# Patient Record
Sex: Female | Born: 1960 | Race: White | Hispanic: No | Smoking: Never smoker
Health system: Southern US, Community
[De-identification: ages and names within clinical notes are randomized; demographics above are authoritative.]

---

## 2005-08-15 ENCOUNTER — Ambulatory Visit: Payer: Self-pay | Admitting: Sports Medicine

## 2005-09-05 ENCOUNTER — Ambulatory Visit: Payer: Self-pay | Admitting: Sports Medicine

## 2006-06-19 ENCOUNTER — Ambulatory Visit: Payer: Self-pay | Admitting: Sports Medicine

## 2006-06-27 ENCOUNTER — Encounter: Admission: RE | Admit: 2006-06-27 | Discharge: 2006-06-27 | Payer: Self-pay | Admitting: Sports Medicine

## 2006-07-04 ENCOUNTER — Encounter: Admission: RE | Admit: 2006-07-04 | Discharge: 2006-07-04 | Payer: Self-pay | Admitting: Sports Medicine

## 2006-09-25 ENCOUNTER — Ambulatory Visit: Payer: Self-pay | Admitting: Sports Medicine

## 2007-04-30 ENCOUNTER — Ambulatory Visit: Payer: Self-pay | Admitting: Sports Medicine

## 2007-04-30 DIAGNOSIS — M7750 Other enthesopathy of unspecified foot: Secondary | ICD-10-CM | POA: Insufficient documentation

## 2007-04-30 DIAGNOSIS — M7040 Prepatellar bursitis, unspecified knee: Secondary | ICD-10-CM | POA: Insufficient documentation

## 2007-09-10 ENCOUNTER — Ambulatory Visit: Payer: Self-pay | Admitting: Sports Medicine

## 2007-09-10 DIAGNOSIS — M76899 Other specified enthesopathies of unspecified lower limb, excluding foot: Secondary | ICD-10-CM | POA: Insufficient documentation

## 2007-09-10 DIAGNOSIS — M722 Plantar fascial fibromatosis: Secondary | ICD-10-CM | POA: Insufficient documentation

## 2007-09-10 LAB — CONVERTED CEMR LAB
Anti Nuclear Antibody(ANA): NEGATIVE
HCT: 42 % (ref 36.0–46.0)
MCV: 89.7 fL (ref 78.0–100.0)
Platelets: 308 10*3/uL (ref 150–400)
RBC: 4.68 M/uL (ref 3.87–5.11)
Uric Acid, Serum: 5.2 mg/dL (ref 2.4–7.0)
WBC: 8.5 10*3/uL (ref 4.0–10.5)

## 2007-09-16 ENCOUNTER — Telehealth: Payer: Self-pay | Admitting: *Deleted

## 2007-10-15 ENCOUNTER — Ambulatory Visit: Payer: Self-pay | Admitting: Sports Medicine

## 2007-12-31 ENCOUNTER — Ambulatory Visit: Payer: Self-pay | Admitting: Sports Medicine

## 2008-02-24 ENCOUNTER — Ambulatory Visit: Payer: Self-pay | Admitting: Family Medicine

## 2008-06-05 ENCOUNTER — Ambulatory Visit: Payer: Self-pay | Admitting: Sports Medicine

## 2008-06-05 DIAGNOSIS — M25519 Pain in unspecified shoulder: Secondary | ICD-10-CM | POA: Insufficient documentation

## 2010-06-23 ENCOUNTER — Ambulatory Visit: Payer: Self-pay | Admitting: Sports Medicine

## 2010-09-13 NOTE — Assessment & Plan Note (Signed)
Summary: 2 SETS OF ORTHOTICS,MC   Vital Signs:  Patient profile:   50 year old female Pulse rate:   72 / minute BP sitting:   142 / 82  (right arm)  Vitals Entered By: Rochele Pages RN (June 23, 2010 9:34 AM) CC: new orthotics   CC:  new orthotics.  History of Present Illness: Jerney returns w orthtoics that were first made > 6 years ago.  These have helped her resolve PF pain in addition had bilat MT pain and with additional padding we have gotten rid of thatr  however she cannot walk without shoes for more than short distance cannot go without orthotics for any period of time or pain returns  current ones are not feeling supportive now orthotic memory plastic on both appears to have fractured  Physical Exam  General:  Well-developed,well-nourished,in no acute distress; alert,appropriate and cooperative throughout examination Msk:  feet-arch collapses bilaterally with weigth bearing, 3rd metatarsal with mild hammer toe, point tenderness of 3rd metatarsal diaphysis and head, no significant tenderness with palpation of plantar fascia bilaterally  calluses on MT areas are less now that in orthotics  gait pronated at rest   Impression & Recommendations:  Problem # 1:  PLANTAR FASCIITIS, BILATERAL (ICD-728.71)  Her updated medication list for this problem includes:    Diclofenac Sodium 75 Mg Tbec (Diclofenac sodium) .Marland Kitchen... 1 by mouth bid  Patient was fitted for a standard, cushioned, semi-rigid orthotic.  The orthotic was heated and the patient stood on the orthotic blank positioned on the orthotic stand. The patient was positioned in subtalar neutral position and 10 degrees of ankle dorsiflexion in a weight bearing stance. After completion of molding a stable based was applied to the orthotic blank.   The blank was ground to a stable position for weight bearing. size 9 bloue swirl and 9 black stripe base  blue EVA and on blackstripe used white firm polysytrene posting   first ray posts on blue swirl additional orthotic padding  MT on left/ neuroma on lat RT for each orthotic  gait comfortable and pornation controlled on completion  Orders: Orthotic Materials, each unit (L3002)  Problem # 2:  METATARSALGIA (ICD-726.70)  trial of MT padding on all orthotics and on regular shoes  using lat neuroma pad on RT to accomplish this  Orders: Orthotic Materials, each unit (L3002)  reck as needed pending response  Complete Medication List: 1)  Colchicine 0.6 Mg Tabs (Colchicine) .Marland Kitchen.. 1 by mouth bid 2)  Diclofenac Sodium 75 Mg Tbec (Diclofenac sodium) .Marland Kitchen.. 1 by mouth bid 3)  Ranitidine Hcl 150 Mg Caps (Ranitidine hcl) .Marland Kitchen.. 1 by mouth bid   Orders Added: 1)  Est. Patient Level IV [10272] 2)  Orthotic Materials, each unit [L3002]

## 2011-01-04 ENCOUNTER — Encounter: Payer: Self-pay | Admitting: *Deleted

## 2011-01-04 NOTE — Progress Notes (Signed)
  Subjective:    Patient ID: Diamond Stanton, female    DOB: Sep 07, 1960, 50 y.o.   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 Faxed the letter to the school at [phone].    Review of Systems     Objective:   Physical Exam        Assessment & Plan:

## 2012-08-09 ENCOUNTER — Ambulatory Visit (INDEPENDENT_AMBULATORY_CARE_PROVIDER_SITE_OTHER): Payer: BC Managed Care – PPO | Admitting: Sports Medicine

## 2012-08-09 ENCOUNTER — Ambulatory Visit
Admission: RE | Admit: 2012-08-09 | Discharge: 2012-08-09 | Disposition: A | Discharge status: Home / Self Care | Payer: BC Managed Care – PPO | Source: Ambulatory Visit | Attending: Sports Medicine | Admitting: Sports Medicine

## 2012-08-09 VITALS — BP 152/92 | Ht 65.0 in | Wt 210.0 lb

## 2012-08-09 DIAGNOSIS — M79609 Pain in unspecified limb: Secondary | ICD-10-CM

## 2012-08-09 DIAGNOSIS — M21619 Bunion of unspecified foot: Secondary | ICD-10-CM

## 2012-08-09 DIAGNOSIS — M21629 Bunionette of unspecified foot: Secondary | ICD-10-CM

## 2012-08-09 DIAGNOSIS — M79673 Pain in unspecified foot: Secondary | ICD-10-CM

## 2012-08-09 NOTE — Progress Notes (Signed)
  Subjective:    Patient ID: Diamond Stanton, female    DOB: [birth year], 51 y.o.   MRN: [id number]  HPI chief complaint: Left foot pain  Patient comes in today complaining of left foot pain. Pain began after she injured herself back in October. She's not sure of the exact mechanism of injury but she began to have pain across the dorsum of her foot shortly thereafter. He's had problems with both of her feet in the past. In fact, she has seen Dr. Darrick Penna and has custom orthotics in her shoes today. She has a history of bunionettes in each of her feet but she feels like the distal fifth metatarsal is a little more prominent since her injury. She denies any swelling or ecchymosis at the time of the injury. Her pain is intermittent. She has been able to weight-bear.    Review of Systems     Objective:   Physical Exam Well-developed, well-nourished. No acute distress. Awake alert and oriented x3  Left foot: In the standing position she has complete collapse of the transverse arch with a noticeable bunionette. She is tender to palpation over the bunionette but there is no soft tissue swelling. No pain with metatarsal squeeze. Some tenderness to palpation along the fifth metatarsal shaft but not marked. Neurovascularly intact distally. Walking without a limp.  X-rays of her left foot including AP, lateral, and oblique views in the standing position were obtained. X-rays were compared to foot x-rays from 2007. Overall there is been no real change. There is subluxation of the fifth toe on both sets of films. There is no obvious fracture.       Assessment & Plan:  1. Left foot pain secondary to bunionette  I inspected her custom orthotics. They are in good condition. I will try a simple arch strap in addition to the orthotics and I've asked the patient to wear shoes with a wide toe box. I've reassured her that I see no new abnormality on her x-rays when compared to films from 2007. She can continue with  activity as tolerated and will followup if symptoms persist or worsen.

## 2014-05-02 IMAGING — CR DG FOOT COMPLETE 3+V*L*
3 series · 3 of 3 positions shown · non-contrast
Comparison: 06/27/2006

CLINICAL DATA: Pain post fall.

LEFT FOOT - COMPLETE 3+ VIEW

[w foot lat left *]
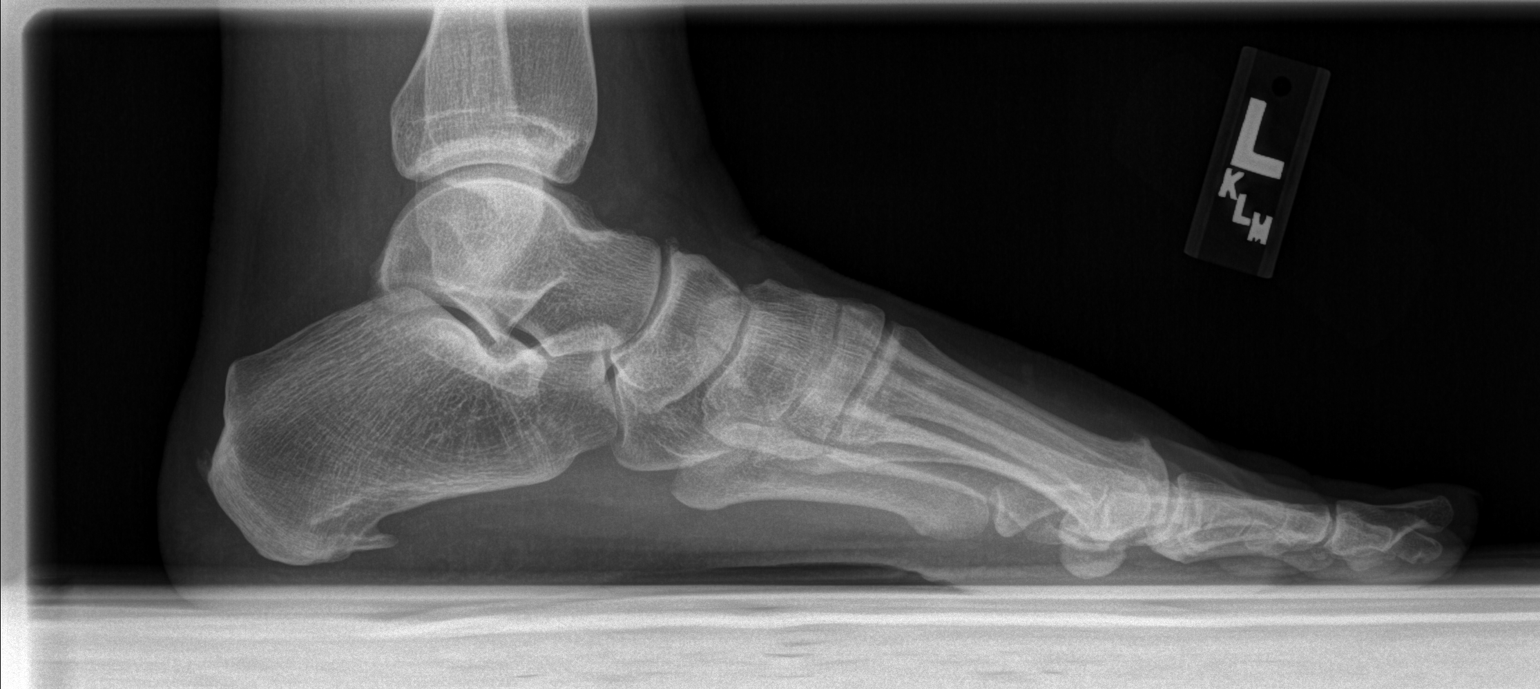

[view not recorded (1 of 2)]
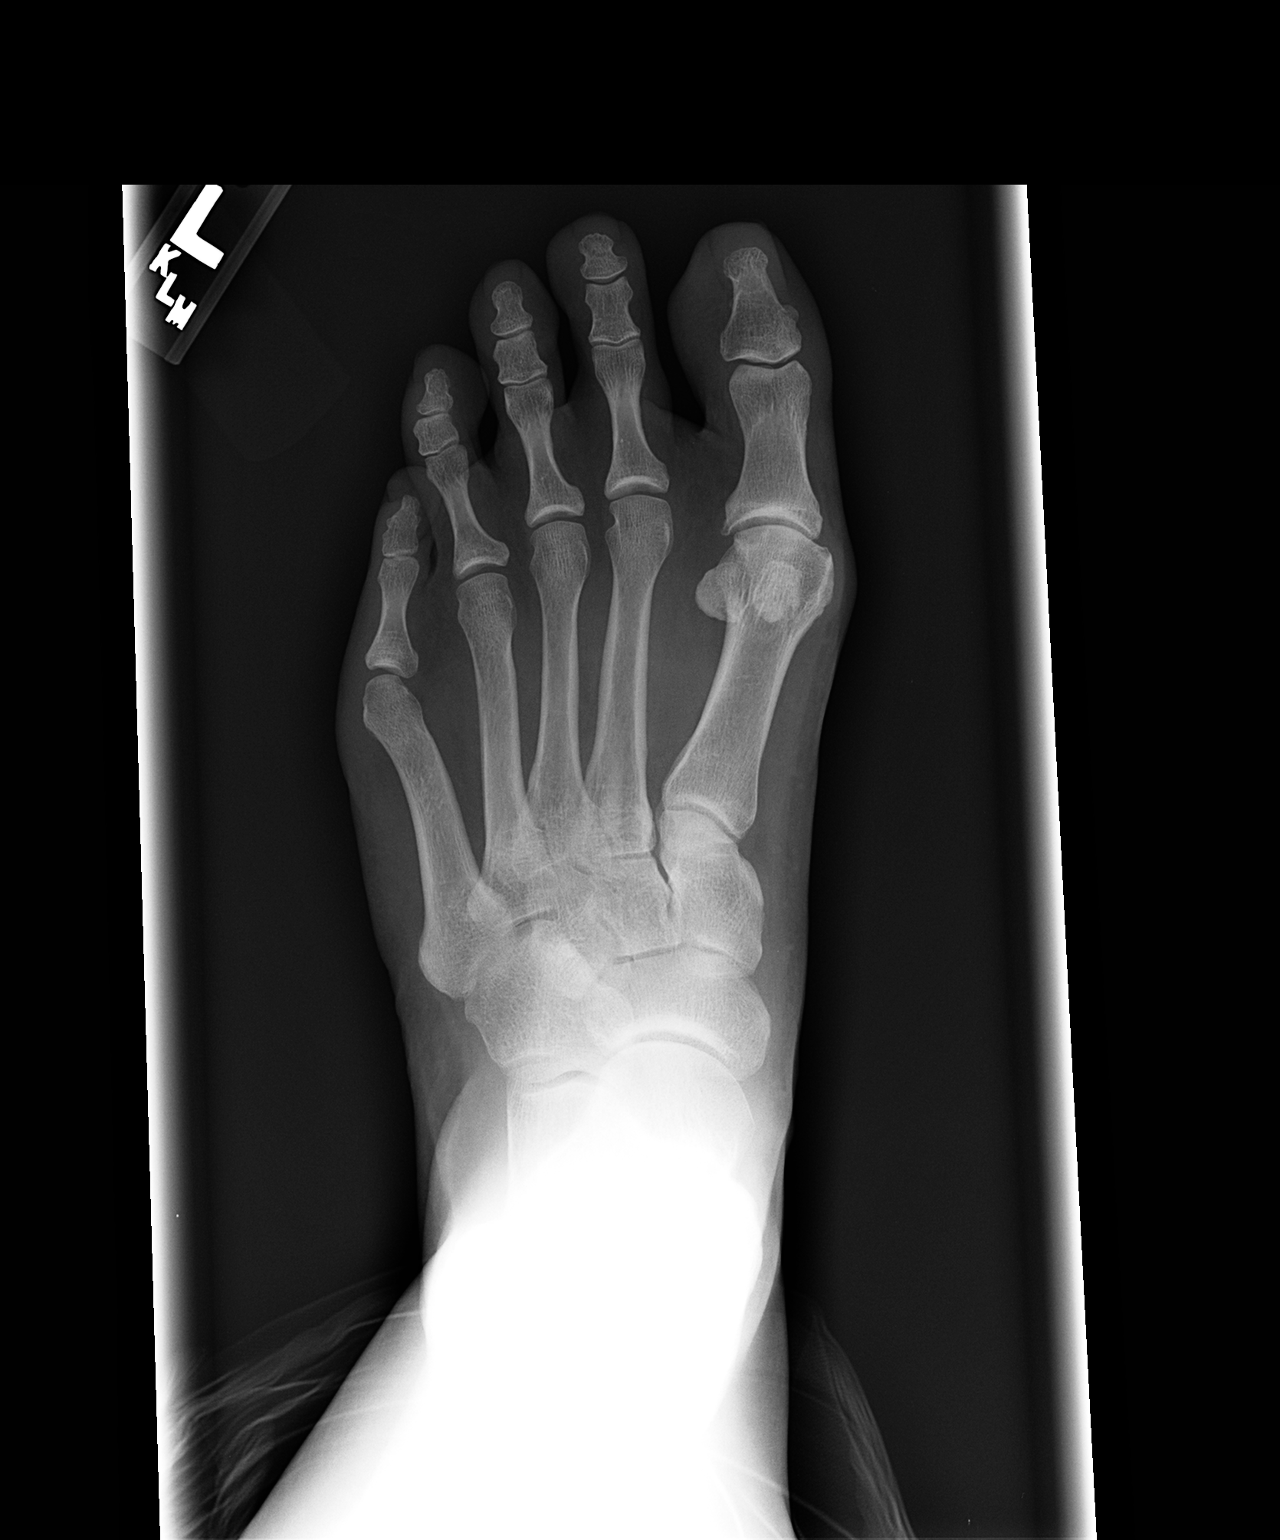

[view not recorded (2 of 2)]
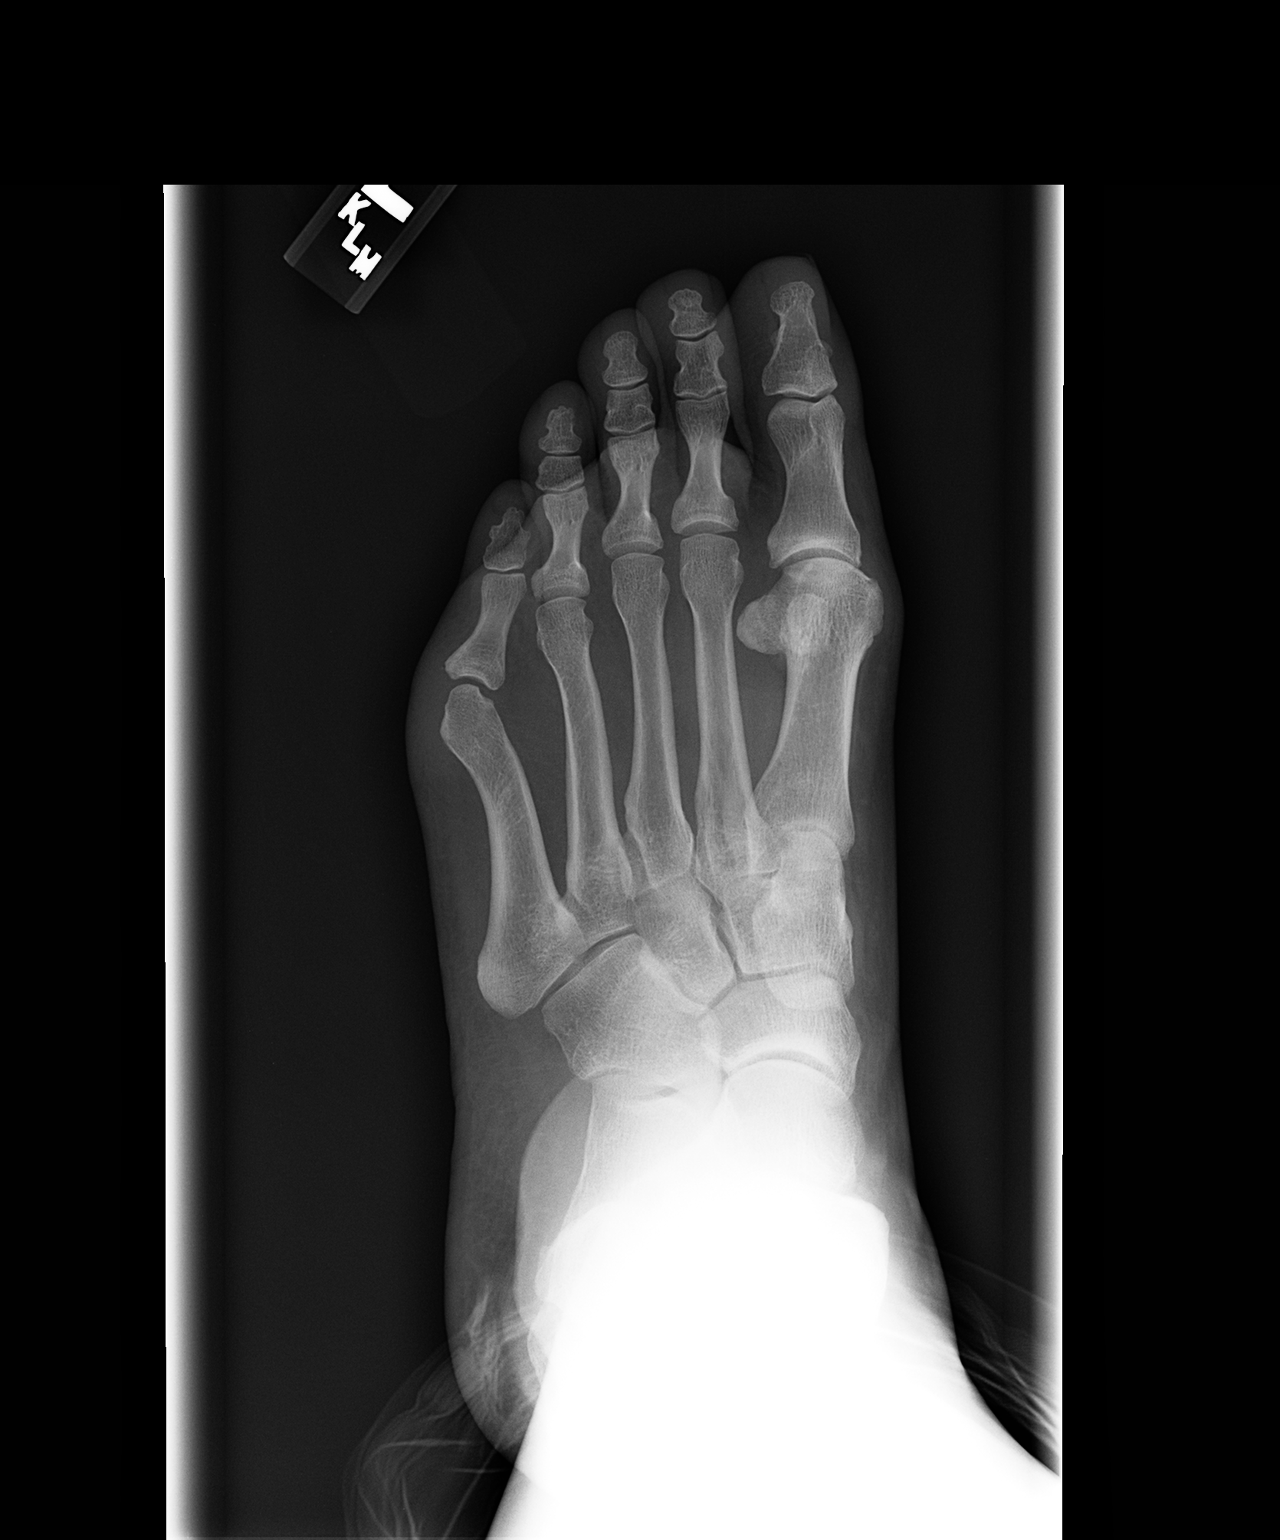

[3 of 3 positions shown; findings below may reference images not displayed]

FINDINGS: Small calcaneal spurs.  Bunion and bunionette formation
as before.  Negative for fracture, dislocation, or other acute bony
abnormality.  Normal mineralization.
IMPRESSION: 1.  Negative for fracture or other acute bony abnormality.

## 2014-06-03 ENCOUNTER — Encounter: Payer: Self-pay | Admitting: Sports Medicine

## 2014-06-03 ENCOUNTER — Ambulatory Visit (INDEPENDENT_AMBULATORY_CARE_PROVIDER_SITE_OTHER): Payer: BC Managed Care – PPO | Admitting: Sports Medicine

## 2014-06-03 VITALS — BP 124/56 | HR 88 | Ht 65.0 in | Wt 200.0 lb

## 2014-06-03 DIAGNOSIS — M6700 Short Achilles tendon (acquired), unspecified ankle: Secondary | ICD-10-CM | POA: Diagnosis not present

## 2014-06-03 NOTE — Assessment & Plan Note (Addendum)
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 -We put in heel lifts in your shoes to unload your achilles tendons to relieve insertional stress -Start heel raises on a book with both feet side-by-side. Do 3 sets of 8 lifts, twice daily. -Try Arnica gel or cream topically to both achilles tendons. -Follow-up in 6-8 weeks for a re-check

## 2014-06-03 NOTE — Patient Instructions (Signed)
-  We put in heel lifts in your shoes to unload your achilles tendons to relieve insertional stress -Start heel raises on a book with both feet side-by-side. Do 3 sets of 8 lifts, twice daily. -Try Arnica gel or cream topically to both achilles tendons. -Follow-up in 6-8 weeks for a re-check

## 2014-06-03 NOTE — Progress Notes (Signed)
   Subjective:    Patient ID: Diamond Stanton, female    DOB: June 28, 1961, 53 y.o.   MRN: [id number]  HPI Ms. Diamond Stanton is a 53 year old female who presents with bilateral ankle pain, the left greater than right. Onset was approximately 2 months ago when she recently completed chemotherapy for breast cancer treatment. She is presently intermission. Her treatment course was complicated by diffuse generalized lymphedema, and she is also status post reconstructive surgery. She has lost finger and toenails is results of her treatments. She would like to be more active, but she says that her ankle pain prevents her from doing so. Location of pain is primarily bilateral posterior insertional Achilles tendon pain. She notes some associated swelling. She currently wears custom orthotics in her shoes which were made many years ago. She has not tried any other medications for this problem.  Past medical history, social history, medications, and allergies were reviewed and are up to date in the chart.   Review of Systems 7 point review of systems was performed and was otherwise negative unless noted in the history of present illness.      Objective:   Physical Exam BP 124/56  Pulse 88  Ht 5\' 5"  (1.651 m)  Wt 200 lb (90.719 kg)  BMI 33.28 kg/m2 GEN: The patient is well-developed well-nourished female and in no acute distress.  She is awake alert and oriented x3. SKIN: warm and well-perfused, no rash  EXTR: No lower extremity edema or calf tenderness Neuro: Strength 5/5 globally. Sensation intact throughout. DTRs 2/4 bilaterally. No focal deficits. Vasc: +2 bilateral distal pulses. No edema.  MSK: Examination of the bilateral feet reveals tenderness to palpation at the Achilles insertion bilaterally, the left greater than right. She has no pain with squeezing and palpation of the Achilles tendon itself. Negative calcaneal squeeze test. Analysis of her foot reveals a bilateral Morton's foot with increased  callus and bunion formation at the first toe. She is a fairly well-preserved medial longitudinal arch. She has no posterior tibialis tendon weakness. There is minimal swelling.  Limited musculoskeletal ultrasound: Long and short axis views were obtained, which showed a calcific spurring and surrounding hypoechoic change at the bilateral distal heel insertion. The bilateral Achilles tendons appear to be normal caliber, measuring 0.42cm on the left and 0.44cm on the right. The remaining posterior tibialis tendons and calcaneus appear normal.    Assessment & Plan:  Please see problem based assessment and plan in the problem list.

## 2014-07-16 ENCOUNTER — Ambulatory Visit: Payer: BC Managed Care – PPO | Admitting: Sports Medicine

## 2014-08-04 ENCOUNTER — Ambulatory Visit (INDEPENDENT_AMBULATORY_CARE_PROVIDER_SITE_OTHER): Payer: BC Managed Care – PPO | Admitting: Sports Medicine

## 2014-08-04 ENCOUNTER — Encounter: Payer: Self-pay | Admitting: Sports Medicine

## 2014-08-04 VITALS — BP 134/88 | HR 83 | Ht 65.0 in | Wt 210.0 lb

## 2014-08-04 DIAGNOSIS — M6700 Short Achilles tendon (acquired), unspecified ankle: Secondary | ICD-10-CM

## 2014-08-04 DIAGNOSIS — I890 Lymphedema, not elsewhere classified: Secondary | ICD-10-CM | POA: Diagnosis not present

## 2014-08-04 NOTE — Progress Notes (Signed)
Patient ID: Diamond Stanton, female   DOB: 26-Jun-1961, 53 y.o.   MRN: [id number]  1 year ago significant swelling in feet after chemo On last visit we tried heel lifts, creams and HEP gentle These have helped and she generally has less pain and a lot of days has no pain over the Achilles  She plans to gradually get back into more exercise but has not been real active She will do some walking but also water exercise or biking  Biggest issue for her is the continued swelling and lymphedema of her left arm She has a custom fitted edema compression chronic covers her left hand and her entire left arm  Physical examination No acute distress BP 134/88 mmHg  Pulse 83  Ht 5\' 5"  (1.651 m)  Wt 210 lb (95.255 kg)  BMI 34.95 kg/m2  The Achilles bilaterally feels normal width No tenderness to palpation No redness or swelling She can walk without a limp  She has orthotics and a heel lift and they look stable  Left arm in spite of the compression sleeve still has a lot of "woody- feeling" edema and swelling

## 2014-08-04 NOTE — Assessment & Plan Note (Signed)
This has improved with the current treatment  I would like her to increase the intensity of her exercise to make the calves stronger  Gradually ease back into more weightbearing exercise  Recheck when necessary if Achilles pain

## 2014-08-04 NOTE — Assessment & Plan Note (Signed)
Because this is persistently troublesome even though is making some improvement we suggested a company that makes a product for compression that may be more comfortable  She plans to look into this product

## 2014-08-04 NOTE — Patient Instructions (Signed)
Your Achilles tendons feel better today on exam  We need to increase the strength exercise to keep this from coming back  Do 1 leg heel lifts on a step  Do some heel raises with weights  See me if this flares or causes pain

## 2015-08-05 ENCOUNTER — Encounter: Payer: Self-pay | Admitting: Sports Medicine

## 2015-08-05 ENCOUNTER — Ambulatory Visit (INDEPENDENT_AMBULATORY_CARE_PROVIDER_SITE_OTHER): Payer: BC Managed Care – PPO | Admitting: Sports Medicine

## 2015-08-05 VITALS — BP 130/82 | Ht 65.0 in | Wt 210.0 lb

## 2015-08-05 DIAGNOSIS — R269 Unspecified abnormalities of gait and mobility: Secondary | ICD-10-CM | POA: Diagnosis not present

## 2015-08-05 DIAGNOSIS — M722 Plantar fascial fibromatosis: Secondary | ICD-10-CM | POA: Diagnosis not present

## 2015-08-06 NOTE — Progress Notes (Signed)
   Subjective:    Patient ID: Diamond Stanton, female    DOB: May 04, 1961, 54 y.o.   MRN: [id number]  HPI chief complaint: Bilateral foot pain  Patient comes in today requesting two new pairs of custom orthotics. She has had custom orthotics made in the past. Her current orthotics are about 52 years old and quite worn. She has a history of metatarsalgia, plantar fasciitis, and Achilles tendinopathy. Today she is without complaint.  Interim medical history reviewed Medications reviewed Allergies reviewed    Review of Systems    as above Objective:   Physical Exam  Well-developed, well-nourished. No acute distress.  Examination of both feet show transverse arch collapse with bilateral Morton's foot. Fairly well-preserved longitudinal arch. Walking without a limp.      Assessment & Plan:  Transverse arch collapse, bilateral feet  Patient had two new pairs of custom orthotics constructed today. I replicated her old orthotics. I added a first ray post, a left metatarsal pad, and a right neuroma pad to both sets of orthotics. She found them comfortable prior to leaving the office. She will follow-up as needed.  Total time spent with the patient was 1 hour with greater than 50% of the time spent in face-to-face consultation discussing orthotic construction, instruction, and fitting.  Patient was fitted for a : standard, cushioned, semi-rigid orthotic. The orthotic was heated and afterward the patient stood on the orthotic blank positioned on the orthotic stand. The patient was positioned in subtalar neutral position and 10 degrees of ankle dorsiflexion in a weight bearing stance. After completion of molding, a stable base was applied to the orthotic blank. The blank was ground to a stable position for weight bearing. Size: 9 Base: Blue EVA Posting: B/L first ray posts Additional orthotic padding: Left MT pad, right neuroma pad  Patient was fitted for a : standard, cushioned,  semi-rigid orthotic. The orthotic was heated and afterward the patient stood on the orthotic blank positioned on the orthotic stand. The patient was positioned in subtalar neutral position and 10 degrees of ankle dorsiflexion in a weight bearing stance. After completion of molding, a stable base was applied to the orthotic blank. The blank was ground to a stable position for weight bearing. Size:9 Base: Blue EVA Posting: B/L first ray posts Additional orthotic padding: Left MT pad, right neuroma pad

## 2018-02-05 ENCOUNTER — Encounter: Payer: Self-pay | Admitting: Sports Medicine

## 2018-02-05 ENCOUNTER — Ambulatory Visit: Payer: BC Managed Care – PPO | Admitting: Sports Medicine

## 2018-02-05 VITALS — BP 136/86 | Ht 64.0 in | Wt 203.0 lb

## 2018-02-05 DIAGNOSIS — G5763 Lesion of plantar nerve, bilateral lower limbs: Secondary | ICD-10-CM

## 2018-02-05 DIAGNOSIS — M7742 Metatarsalgia, left foot: Secondary | ICD-10-CM

## 2018-02-05 DIAGNOSIS — M7741 Metatarsalgia, right foot: Secondary | ICD-10-CM

## 2018-02-05 DIAGNOSIS — R269 Unspecified abnormalities of gait and mobility: Secondary | ICD-10-CM | POA: Diagnosis not present

## 2018-02-05 NOTE — Progress Notes (Signed)
Chief complaint: Bilateral plantar foot pain x6 weeks  History of present illness: Diamond Stanton is a 57 year old female who presents to the sports medicine office today with chief complaint of bilateral foot pain.  She has a long-standing issue of bilateral foot pain related to plantar fasciitis, metatarsalgia, and Morton's feet.  She has had a history of bunionectomy and bunionettectomy performed several years ago down in [facility] on her right foot.  She has a bunion and bunionette present on the left side. She is not interested in surgical intervention.  She has had several pairs of orthotics made for her previously here, most recent being 2 sets of orthotics 3 years ago.  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  She does not report of any specific inciting incident, trauma, or injury to explain these new symptoms that she has, but she has noticed gradual onset of worsening pain in both of her feet.  She describes the pain as a throbbing, aching, and cramping pain.  She notices around middle in the morning that symptoms become very noticeable and become problematic for her until she sits down and elevates her leg.  She does not report of any numbness, tingling, or burning paresthesias.  She does not report of any warmth, erythema, or any major swelling.   Since last orthtotics has had breast cancer and required chemotherapy. She currently is on tamoxifen.  She is trying to lose weight, she knows that over the last few years she has gained about 50 or 60 pounds, but reports that she is on a weight loss program as she has already lost 30 pounds.  She has not had to use any medicines for pain.  Fortunately, no symptoms wake her up from sleep at nighttime.  Review of systems:  As stated above  Her past medical history, surgical history, family history, and social history obtained and reviewed. 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  Physical exam: Vital signs are reviewed and are documented in the chart Gen.: Alert, oriented, appears stated age, in no apparent distress HEENT: Moist oral mucosa Respiratory: Normal respirations, able to speak in full sentences Cardiac: Regular rate, distal pulses 2+, left UE lymphedema noted Integumentary: No rashes on visible skin:  Neurologic: She does have intact strength with bilateral ankle dorsiflexion, plantarflexion, inversion, eversion, great toe strength on both sides are intact, sensation 2+ in bilateral lower extremities Psych: Normal affect, mood is described as good Musculoskeletal: Inspection of both of her feet reveal no obvious deformity or muscle atrophy, she does have some slight swelling over the anterolateral aspect of her right ankle 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, no warmth, erythema, ecchymosis noted otherwise; on her left foot, she is tender to palpation over the plantar fascia near its calcaneal insertion, she is also tender to palpation on the plantar aspect of the third and fourth metatarsal heads, as well as interdigitally, bunion and bunionette are recognized on the left foot; similarly, she is also tender to palpation on the plantar aspect of the right foot over the third and fourth metatarsal heads as well as interdigitally, she does have Morton's feet with toe splaying on both sides, on gait ambulation she walks with a  supinated gait, has well-healed incisional scars from previous bunionectomy and bunionettectomy  Assessment and plan: 1.  Bilateral foot pain, suspect secondary to abnormality of gait, with recurrent issues related to her Morton's feet, metatarsalgia on both feet, and plantar fasciitis currently  involving the left foot 2.  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  Suspect that her issue today is mostly coming from metatarsalgia and her Morton's feet.  Did make a few changes today to her current pair of orthotics.  On 2 sets of orthotics, did take off the old metatarsal padding and placed metatarsal cookie, as well as a high heel lift (5/16 in).  On the third set, did place a smaller metatarsal pad as well as a small heel lift (3/16) to have her try this out and see which one of the two is most comfortable to her.  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  Also discussed foot hypersensitivity secondary to chemotherapy.  In regards to swelling in her left arm and her feet and ankles at times, did provide her information for compression stockings.  Will have her follow-up if she is not making any interval improvement in symptoms.  Hopefully this summer break will help her out.   Diamond Stanton, M.D. Primary Care Sports Medicine Fellow [facility]  I observed and examined the patient with the [facility] and agree with assessment and plan.  Note reviewed and modified by me. Enid BaasKarl Fields, MD

## 2018-02-05 NOTE — Patient Instructions (Signed)
April Wilson CEP Compression Arm Sleeves [phone]

## 2019-08-05 ENCOUNTER — Ambulatory Visit: Payer: BC Managed Care – PPO | Admitting: Sports Medicine

## 2019-08-05 ENCOUNTER — Other Ambulatory Visit: Payer: Self-pay

## 2019-08-05 ENCOUNTER — Encounter: Payer: BC Managed Care – PPO | Admitting: Sports Medicine

## 2019-08-05 ENCOUNTER — Encounter: Payer: Self-pay | Admitting: Sports Medicine

## 2019-08-05 VITALS — BP 122/78 | Ht 65.0 in | Wt 200.0 lb

## 2019-08-05 DIAGNOSIS — M21962 Unspecified acquired deformity of left lower leg: Secondary | ICD-10-CM | POA: Diagnosis not present

## 2019-08-05 DIAGNOSIS — R269 Unspecified abnormalities of gait and mobility: Secondary | ICD-10-CM | POA: Diagnosis not present

## 2019-08-05 DIAGNOSIS — M21961 Unspecified acquired deformity of right lower leg: Secondary | ICD-10-CM | POA: Diagnosis not present

## 2019-08-05 NOTE — Progress Notes (Signed)
PCP: Alfred Levins., MD  Subjective:   HPI: Patient is a 58 y.o. female here for custom orthotics.  Patient last had orthotics made for her back in 2019 by Dr. Darrick Penna.  Patient returns today to have a second set of orthotics made for her to put in different pair shoes.  Patient notes her last pair of orthotics helped her considerably.  There made for supination gait deformity as well as bilateral bunions and bunionette's and flattening of the transverse arch.  Patient denies any current numbness or tingling in her feet.  She has no bruising or swelling.  Patient denies any current pain however she notes she does get pain along the arch of her foot if she does not wear the orthotic.  Review of Systems: See HPI above.  History reviewed. No pertinent past medical history.  Current Outpatient Medications on File Prior to Visit  Medication Sig Dispense Refill  . lisinopril (ZESTRIL) 5 MG tablet take 1 TABLET BY MOUTH daily    . tamoxifen (NOLVADEX) 20 MG tablet Take 1 tablet (20 mg total) by mouth daily.    Marland Kitchen oxyCODONE (OXY IR/ROXICODONE) 5 MG immediate release tablet     . promethazine (PHENERGAN) 25 MG tablet     . tamoxifen (NOLVADEX) 20 MG tablet Take 1 tablet (20 mg total) by mouth 2 (two) times daily.    Marland Kitchen venlafaxine XR (EFFEXOR-XR) 37.5 MG 24 hr capsule Take 1 capsule (37.5 mg total) by mouth daily.     No current facility-administered medications on file prior to visit.    History reviewed. No pertinent surgical history.  Allergies  Allergen Reactions  . Cinnamon Anaphylaxis    Social History   Socioeconomic History  . Marital status: Divorced    Spouse name: Not on file  . Number of children: Not on file  . Years of education: Not on file  . Highest education level: Not on file  Occupational History  . Not on file  Tobacco Use  . Smoking status: Never Smoker  . Smokeless tobacco: Never Used  Substance and Sexual Activity  . Alcohol use: Not on file  . Drug use: Not  on file  . Sexual activity: Not on file  Other Topics Concern  . Not on file  Social History Narrative  . Not on file   Social Determinants of Health   Financial Resource Strain:   . Difficulty of Paying Living Expenses: Not on file  Food Insecurity:   . Worried About Programme researcher, broadcasting/film/video in the Last Year: Not on file  . Ran Out of Food in the Last Year: Not on file  Transportation Needs:   . Lack of Transportation (Medical): Not on file  . Lack of Transportation (Non-Medical): Not on file  Physical Activity:   . Days of Exercise per Week: Not on file  . Minutes of Exercise per Session: Not on file  Stress:   . Feeling of Stress : Not on file  Social Connections:   . Frequency of Communication with Friends and Family: Not on file  . Frequency of Social Gatherings with Friends and Family: Not on file  . Attends Religious Services: Not on file  . Active Member of Clubs or Organizations: Not on file  . Attends Banker Meetings: Not on file  . Marital Status: Not on file  Intimate Partner Violence:   . Fear of Current or Ex-Partner: Not on file  . Emotionally Abused: Not on file  .  Physically Abused: Not on file  . Sexually Abused: Not on file    History reviewed. No pertinent family history.      Objective:  Physical Exam: BP 122/78   Ht 5\' 5"  (1.651 m)   Wt 200 lb (90.7 kg)   BMI 33.28 kg/m  Gen: NAD, comfortable in exam room Lungs: Breathing comfortably on room air Ankle/Foot Exam Bilateral -Inspection: Bilateral bunion deformity with bunion noted on the left-hand side.  Flattening of the transverse arch with splaying of the first and second toes.  Slight collapse of the longitudinal arch bilaterally -Palpation: No tenderness palpation -ROM: Normal ROM with dorsiflexion, plantarflexion, inversion, eversion -Limb neurovascularly intact, no instability noted  -Gait: Slight supinated gait    Assessment & Plan:  Patient is a 58 y.o. female here for  custom orthotics  1.  Bilateral foot abnormality with abnormal gait Patient was fitted for a : standard, cushioned, semi-rigid orthotic. The orthotic was heated and afterward the patient stood on the orthotic blank positioned on the orthotic stand. The patient was positioned in subtalar neutral position and 10 degrees of ankle dorsiflexion in a weight bearing stance. After completion of molding, a stable base was applied to the orthotic blank. The blank was ground to a stable position for weight bearing. Size: 8 Base: Blue EVA Posting: First ray post.  5/16 inch heel lift.  Metatarsal cookie.  Patient's gait was in neutral position following custom orthotics.  Patient was advised to return to have modifications made as needed.  Patient seen and evaluated with the sports medicine fellow.  I agree with the above plan of care.  Orthotics were constructed as above.  Patient found them to be comfortable prior to leaving the office.  Follow-up as needed.

## 2021-02-25 ENCOUNTER — Other Ambulatory Visit: Payer: Self-pay

## 2021-02-25 ENCOUNTER — Ambulatory Visit: Payer: BC Managed Care – PPO | Admitting: Sports Medicine

## 2021-02-25 VITALS — BP 126/78 | Ht 64.5 in | Wt 196.0 lb

## 2021-02-25 DIAGNOSIS — M2141 Flat foot [pes planus] (acquired), right foot: Secondary | ICD-10-CM | POA: Diagnosis not present

## 2021-02-25 DIAGNOSIS — M21962 Unspecified acquired deformity of left lower leg: Secondary | ICD-10-CM

## 2021-02-25 DIAGNOSIS — M2142 Flat foot [pes planus] (acquired), left foot: Secondary | ICD-10-CM | POA: Diagnosis not present

## 2021-02-25 DIAGNOSIS — M21611 Bunion of right foot: Secondary | ICD-10-CM

## 2021-02-25 DIAGNOSIS — M21961 Unspecified acquired deformity of right lower leg: Secondary | ICD-10-CM

## 2021-02-25 DIAGNOSIS — M21612 Bunion of left foot: Secondary | ICD-10-CM

## 2021-02-25 DIAGNOSIS — R269 Unspecified abnormalities of gait and mobility: Secondary | ICD-10-CM

## 2021-02-25 NOTE — Progress Notes (Signed)
Patient ID: [name], female   DOB: September 20, 1960, 61 y.o.   MRN: [id number]  Diamond Stanton presents today for new custom orthotics.  She is requesting two new pair.  She has a well-documented history of chronic foot problems which have been helped tremendously by orthotics.  Her current orthotics have a metatarsal cookie, first ray post, and heel lift bilaterally.  Only complaint today is some mild pain secondary to a bunion on her left foot.  Physical exam of her feet show transverse arch collapse bilaterally.  Bilateral bunion deformity, left greater than right.  Fairly well-preserved longitudinal arch.  Good pulses.  New custom orthotics ( two pair) were created as below.  Return as needed for orthotic modifications.  Otherwise, follow-up as needed.  Patient was fitted for a : standard, cushioned, semi-rigid orthotic. The orthotic was heated and afterward the patient stood on the orthotic blank positioned on the orthotic stand. The patient was positioned in subtalar neutral position and 10 degrees of ankle dorsiflexion in a weight bearing stance. After completion of molding, a stable base was applied to the orthotic blank. The blank was ground to a stable position for weight bearing. Size: 8 Base: Blue EVA Posting: B/L first ray posts Additional orthotic padding: B/L metatarsal cookies and B/L 5/16 inch heel lifts

## 2022-07-25 ENCOUNTER — Ambulatory Visit: Payer: BC Managed Care – PPO | Admitting: Sports Medicine

## 2022-08-01 ENCOUNTER — Ambulatory Visit: Payer: BC Managed Care – PPO | Admitting: Sports Medicine

## 2022-08-01 VITALS — BP 140/72 | Ht 64.5 in | Wt 200.0 lb

## 2022-08-01 DIAGNOSIS — R269 Unspecified abnormalities of gait and mobility: Secondary | ICD-10-CM | POA: Diagnosis not present

## 2022-08-02 NOTE — Progress Notes (Signed)
   Subjective:    Patient ID: Diamond Stanton, female    DOB: 23-Nov-1960, 61 y.o.   MRN: [id number]  HPI chief complaint: Bilateral foot pain  Presents today complaining of bilateral foot pain.  She has a well-documented history of chronic bilateral foot pain which has responded well to custom orthotics in the past.  We made her 2 pairs of orthotics in July 2022.  Those orthotics were fitted with first ray posts, metatarsal cookies, and 5/16 inch heel lifts.  She has recently begun to experience some returning pain in her Achilles as well as diffuse pain in her feet.  She feels like her orthotics may be causing her to supinate a little bit more than she should.  Her daughter is a traveling physical therapist and has shown her some stretches to do for her Achilles tendons which have been helpful.    Review of Systems As above    Objective:   Physical Exam  Well-developed, well-nourished.  No acute distress  Examination of her feet still show transverse arch collapse bilaterally.  Evaluation of her gait does show slight supination with orthotics in place.  No limp.      Assessment & Plan:   Bilateral foot pain  I inspected both sets of jewels orthotics today.  The orthotics appear to be in pretty good shape.  We remove the heel lifts from 1 set of orthotics and replaced them with lateral heel wedges.  Evaluation of her gait afterwards did show resolution of her previous supination.  She will give these a trial run for a week or 2.  If she finds that this adjustment has been helpful then she will return to the office to have the heel lifts removed from her other set of orthotics and replaced with lateral heel wedges.  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  She will follow-up as needed.  This note was dictated using Dragon naturally speaking software and may contain errors in syntax, spelling, or content which have not been identified  prior to signing this note.
